# Patient Record
Sex: Female | Born: 1945 | Race: White | Hispanic: No | Marital: Married | Smoking: Never smoker
Health system: Southern US, Community
[De-identification: ages and names within clinical notes are randomized; demographics above are authoritative.]

## PROBLEM LIST (undated history)

## (undated) DIAGNOSIS — I480 Paroxysmal atrial fibrillation: Secondary | ICD-10-CM

## (undated) DIAGNOSIS — I10 Essential (primary) hypertension: Secondary | ICD-10-CM

## (undated) DIAGNOSIS — I509 Heart failure, unspecified: Secondary | ICD-10-CM

## (undated) DIAGNOSIS — M1990 Unspecified osteoarthritis, unspecified site: Secondary | ICD-10-CM

## (undated) DIAGNOSIS — Z853 Personal history of malignant neoplasm of breast: Secondary | ICD-10-CM

## (undated) HISTORY — PX: CHOLECYSTECTOMY OPEN: SUR202

## (undated) HISTORY — PX: WRIST SURGERY: SHX841

## (undated) HISTORY — DX: Paroxysmal atrial fibrillation: I48.0

## (undated) HISTORY — DX: Essential (primary) hypertension: I10

## (undated) HISTORY — PX: TOTAL KNEE ARTHROPLASTY: SHX125

## (undated) HISTORY — PX: MASTECTOMY, RADICAL: SHX710

## (undated) HISTORY — DX: Unspecified osteoarthritis, unspecified site: M19.90

## (undated) HISTORY — DX: Personal history of malignant neoplasm of breast: Z85.3

## (undated) HISTORY — DX: Heart failure, unspecified: I50.9

---

## 2013-09-18 ENCOUNTER — Observation Stay: Payer: Self-pay | Admitting: Student

## 2013-09-18 DIAGNOSIS — I10 Essential (primary) hypertension: Secondary | ICD-10-CM

## 2013-09-18 DIAGNOSIS — R0609 Other forms of dyspnea: Secondary | ICD-10-CM

## 2013-09-18 DIAGNOSIS — R079 Chest pain, unspecified: Secondary | ICD-10-CM

## 2013-09-18 DIAGNOSIS — R0989 Other specified symptoms and signs involving the circulatory and respiratory systems: Secondary | ICD-10-CM

## 2013-09-18 LAB — URINALYSIS, COMPLETE
Bacteria: NONE SEEN
Bilirubin,UR: NEGATIVE
Blood: NEGATIVE
GLUCOSE, UR: NEGATIVE mg/dL (ref 0–75)
LEUKOCYTE ESTERASE: NEGATIVE
Nitrite: NEGATIVE
Ph: 7 (ref 4.5–8.0)
Protein: NEGATIVE
RBC,UR: 1 /HPF (ref 0–5)
SPECIFIC GRAVITY: 1.015 (ref 1.003–1.030)

## 2013-09-18 LAB — CBC
HCT: 38.9 % (ref 35.0–47.0)
HGB: 13.3 g/dL (ref 12.0–16.0)
MCH: 29.8 pg (ref 26.0–34.0)
MCHC: 34.2 g/dL (ref 32.0–36.0)
MCV: 87 fL (ref 80–100)
Platelet: 281 10*3/uL (ref 150–440)
RBC: 4.48 10*6/uL (ref 3.80–5.20)
RDW: 14.9 % — ABNORMAL HIGH (ref 11.5–14.5)
WBC: 8.1 10*3/uL (ref 3.6–11.0)

## 2013-09-18 LAB — COMPREHENSIVE METABOLIC PANEL
ALT: 37 U/L (ref 12–78)
Albumin: 3.7 g/dL (ref 3.4–5.0)
Alkaline Phosphatase: 111 U/L
Anion Gap: 13 (ref 7–16)
BILIRUBIN TOTAL: 0.5 mg/dL (ref 0.2–1.0)
BUN: 16 mg/dL (ref 7–18)
CO2: 18 mmol/L — AB (ref 21–32)
Calcium, Total: 9.8 mg/dL (ref 8.5–10.1)
Chloride: 105 mmol/L (ref 98–107)
Creatinine: 0.91 mg/dL (ref 0.60–1.30)
EGFR (Non-African Amer.): >60
Glucose: 172 mg/dL — ABNORMAL HIGH (ref 65–99)
OSMOLALITY: 277 (ref 275–301)
POTASSIUM: 3.7 mmol/L (ref 3.5–5.1)
SGOT(AST): 21 U/L (ref 15–37)
Sodium: 136 mmol/L (ref 136–145)
Total Protein: 7.9 g/dL (ref 6.4–8.2)

## 2013-09-18 LAB — TROPONIN I
TROPONIN-I: 0.03 ng/mL
TROPONIN-I: 0.03 ng/mL
Troponin-I: 0.03 ng/mL

## 2013-09-18 LAB — CK TOTAL AND CKMB (NOT AT ARMC)
CK, Total: 49 U/L
CK, Total: 65 U/L
CK-MB: 1.1 ng/mL (ref 0.5–3.6)
CK-MB: 1.7 ng/mL (ref 0.5–3.6)

## 2013-09-18 LAB — D-DIMER(ARMC): D-Dimer: 565 ng/ml

## 2013-09-18 LAB — PRO B NATRIURETIC PEPTIDE: B-Type Natriuretic Peptide: 248 pg/mL — ABNORMAL HIGH (ref 0–125)

## 2013-09-18 LAB — PROTIME-INR
INR: 1
PROTHROMBIN TIME: 12.9 s (ref 11.5–14.7)

## 2013-09-19 DIAGNOSIS — I517 Cardiomegaly: Secondary | ICD-10-CM

## 2013-09-19 DIAGNOSIS — R079 Chest pain, unspecified: Secondary | ICD-10-CM

## 2013-09-19 LAB — CBC WITH DIFFERENTIAL/PLATELET
BASOS ABS: 0.1 10*3/uL (ref 0.0–0.1)
Basophil %: 1.1 %
EOS PCT: 0.2 %
Eosinophil #: 0 10*3/uL (ref 0.0–0.7)
HCT: 38.3 % (ref 35.0–47.0)
HGB: 12.9 g/dL (ref 12.0–16.0)
LYMPHS PCT: 16 %
Lymphocyte #: 1.9 10*3/uL (ref 1.0–3.6)
MCH: 29.4 pg (ref 26.0–34.0)
MCHC: 33.7 g/dL (ref 32.0–36.0)
MCV: 87 fL (ref 80–100)
MONO ABS: 1.1 x10 3/mm — AB (ref 0.2–0.9)
Monocyte %: 9.4 %
Neutrophil #: 8.7 10*3/uL — ABNORMAL HIGH (ref 1.4–6.5)
Neutrophil %: 73.3 %
PLATELETS: 261 10*3/uL (ref 150–440)
RBC: 4.4 10*6/uL (ref 3.80–5.20)
RDW: 14.9 % — ABNORMAL HIGH (ref 11.5–14.5)
WBC: 11.9 10*3/uL — ABNORMAL HIGH (ref 3.6–11.0)

## 2013-09-19 LAB — BASIC METABOLIC PANEL
Anion Gap: 12 (ref 7–16)
BUN: 11 mg/dL (ref 7–18)
CALCIUM: 8.8 mg/dL (ref 8.5–10.1)
CO2: 22 mmol/L (ref 21–32)
CREATININE: 0.76 mg/dL (ref 0.60–1.30)
Chloride: 105 mmol/L (ref 98–107)
EGFR (Non-African Amer.): >60
GLUCOSE: 111 mg/dL — AB (ref 65–99)
Osmolality: 278 (ref 275–301)
Potassium: 3.6 mmol/L (ref 3.5–5.1)
SODIUM: 139 mmol/L (ref 136–145)

## 2013-09-19 LAB — TROPONIN I: TROPONIN-I: 0.03 ng/mL

## 2013-09-19 LAB — CK TOTAL AND CKMB (NOT AT ARMC)
CK, Total: 104 U/L
CK-MB: 1.6 ng/mL (ref 0.5–3.6)

## 2013-09-22 ENCOUNTER — Telehealth: Payer: Self-pay

## 2013-09-22 NOTE — Telephone Encounter (Signed)
Patient contacted regarding discharge from [facility] on 09/19/13.  Patient understands to follow up with Ward Givens, NP on 09/24/13 at 1:15 at [facility]. Patient understands discharge instructions? yes Patient understands medications and regiment? yes Patient understands to bring all medications to this visit? yes  Pt reports that she is still experiencing occasional dizziness.

## 2013-09-23 ENCOUNTER — Encounter: Payer: Self-pay | Admitting: *Deleted

## 2013-09-24 ENCOUNTER — Encounter: Payer: Self-pay | Admitting: Nurse Practitioner

## 2013-09-24 ENCOUNTER — Encounter: Payer: Self-pay | Admitting: *Deleted

## 2014-08-15 NOTE — H&P (Signed)
PATIENT NAME:  Julia Kent, Julia Kent MR#:  [id number] DATE OF BIRTH:  [birth year]  DATE OF ADMISSION:  09/18/2013  PRIMARY CARE PHYSICIAN:  Nonlocal at [facility].   CHIEF COMPLAINT:  Sudden onset of dyspnea.   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  The patient said she went to [place] for shopping today.  She did not take her Lasix though this morning to avoid increased frequency of urination while shopping.  All of a sudden she felt sudden onset of shortness of breath, heavy tightening in the chest and had to call EMS who brought her over here.  The patient had a prior cardiac catheterization more than seven years ago and had insignificant coronary artery disease at the time.  She is not sure if she has systolic or diastolic congestive heart failure.  Her chest x-ray and CT chest were clear here without any PE or evidence of pulmonary congestion.  Her BNP is only slightly elevated.  With her worsening dyspneic episodes with diaphoresis, the chest tightness it is considered anginal equivalent, she is being admitted for observation for the same.  She denies any recent illness or other changes.   PAST MEDICAL HISTORY: 1.  Paroxysmal atrial fibrillation.  2.  Congestive heart failure, unknown ejection fraction.  3.  Osteoarthritis.  4.  Hypertension.  5.  History of breast cancer status post surgery, currently in remission.  PAST SURGICAL HISTORY: 1.  Open cholecystectomy.  2.  Left modified radical mastectomy.  3.  Left knee replacement surgery.  4.  Left wrist surgery.    ALLERGIES TO MEDICATIONS:  INTOLERANT OF NSAIDS, ANAPHYLACTIC TO ASPIRIN AND ALSO ALLERGIC TO EGGS.   MEDICATIONS:  1.  Lasix 40 mg by mouth twice daily.  2.  Potassium chloride 20 mEq  by mouth twice daily.  3.  Cardizem 180 mg by mouth daily.  4.  Atorvastatin 40 mg by mouth daily.  5.  Travatan one drop each eye at bedtime.  6.  Micardis, unknown dose.  7.  Neurontin 300 to 600 mg at bedtime for sciatica pain.  8.  Tramadol 50 mg by mouth q. 8 hours as needed.  9.  Vitamin D3 4000 international units daily.   SOCIAL HISTORY:  Retired Equities trader.  No smoking.  Very occasional alcohol intake.   FAMILY HISTORY:  Father died from heart disease in his 50s and no history of any cancer or strokes in the family.   REVIEW OF SYSTEMS:  CONSTITUTIONAL:  No fever, fatigue or weakness.  EYES:  No blurred vision, double vision, inflammation or glaucoma.  EARS, NOSE, THROAT:  No tinnitus, ear pain, hearing loss, epistaxis or discharge.  RESPIRATORY:  No cough, wheeze, hemoptysis or COPD.  CARDIOVASCULAR:  No chest pain.  Positive for tightness and dyspnea on exertion.  No orthopnea, edema, arrhythmias, palpitations, or syncope.  GASTROINTESTINAL:  No nausea, vomiting, diarrhea, abdominal pain, hematemesis, or melena.  GENITOURINARY:  No dysuria, hematuria, renal calculus, frequency or incontinence.  ENDOCRINE:  No polyuria, nocturia, thyroid problems, heat or cold intolerance.  HEMATOLOGY:  No anemia, easy bruising or bleeding.  SKIN:  No acne, rash or lesions.  MUSCULOSKELETAL:  No neck, back, shoulder pain, arthritis or gout.  NEUROLOGIC:  Denies numbness, weakness, CVA, TIA or seizures.  PSYCHOLOGICAL:  No anxiety, insomnia,  depression.   PHYSICAL EXAMINATION: VITAL SIGNS:  Temperature 97.3 degrees Fahrenheit, pulse 60, respirations 26, blood pressure 138/78, pulse ox 100% on room air.  GENERAL:  Heavily built, well-nourished female lying in bed, not in any acute distress.  HEENT:  Normocephalic, atraumatic.  Pupils equal, round, reacting to light.  Anicteric sclerae.  Extraocular movements intact.  Oropharynx clear without erythema, mass or exudates.  NECK:  Supple.   No thyromegaly, JVD or carotid bruits.  No lymphadenopathy.  LUNGS:  Moving air bilaterally.  No wheeze or crackles.  No use of accessory muscles for breathing CARDIOVASCULAR:  S1, S2, regular rate and rhythm.  No murmurs, rubs, or gallops.  ABDOMEN:  Obese, soft, nontender, nondistended.  No hepatosplenomegaly.  Normal bowel sounds.  EXTREMITIES:  No pedal edema.  No clubbing or cyanosis, 2+ dorsalis pedis pulses palpable bilaterally.  SKIN:  No acne, rash or lesions.  LYMPHATICS:  No cervical or inguinal lymphadenopathy.  NEUROLOGIC:  Cranial nerves are intact.  No focal motor or sensory deficits.  PSYCHOLOGICAL:  The patient is awake, alert, oriented x 3.   LABORATORY DATA:  WBC is 8.2, hemoglobin 13.3, hematocrit 38.9, platelet count 281.   Sodium 136, potassium 3.7, chloride 105, bicarb 18, BUN 16, creatinine 0.91, glucose 172 and calcium of 9.8.   ALT 37, AST 21, alk phos 111, total bili 0.5 and albumin of 3.7.  BNP is 248.  Urinalysis negative for any infection.  Chest x-ray showing low lung volumes, no acute findings.  CT of the chest with contrast showing no evidence of PE or other acute findings.  Previous left mastectomy, post radiation changes seen and small hiatal hernia noted.  EKG showing normal sinus rhythm, heart rate of 62.  No acute ST-T wave abnormalities noted.   ASSESSMENT AND PLAN:  A 69 year old diagnosed with history of paroxysmal atrial fibrillation, hypertension, congestive heart failure, admitted for worsening dyspnea and chest tightness for two weeks now.  1.  Angina equivalent with dyspnea on exertion.  CT chest being normal.  We will admit to telemetry.  Recycle cardiac enzymes.  Myoview in a.m.  Cardiology has been consulted.  THE PATIENT IS ANAPHYLACTIC TO ASPIRIN SO WE WILL NOT START ASPIRIN.  Continue to monitor.  2.  Congestive heart failure, unknown ejection fraction.  Follow up at [facility] as prior scheduled. Hold lasix as no fluid overload on chest x-ray or CT  chest seen.  Continue to follow. 3.  Atrial fibrillation in normal sinus rhythm, not on any anticoagulation, has been in normal sinus rhythm for a long time according to the patient.  She has a cardiologist followup at [facility] as an outpatient.  Continue Cardizem for now.  4.  Sciatica.  Continue her home pain medications.  5.  CODE STATUS:  FULL CODE.    TIME SPENT ON ADMISSION:  50 minutes.     ____________________________ Gladstone Lighter, MD rk:ea D: 09/18/2013 19:54:29 ET T: 09/18/2013 23:31:02 ET JOB#: [id number]  cc: Gladstone Lighter, MD, <Dictator> Gladstone Lighter MD ELECTRONICALLY SIGNED 09/19/2013 12:44

## 2014-08-15 NOTE — Consult Note (Signed)
PATIENT NAME:  Julia Kent, Julia Kent MR#:  [id number] DATE OF BIRTH:  05/02/1945  DATE OF CONSULTATION:  09/18/2013  REFERRING PHYSICIAN: Dr. Nemiah CommanderKalisetti CONSULTING PHYSICIAN:  Jerolyn CenterMuhammad A. Kirke CorinArida, MD  REASON FOR CONSULTATION: Shortness of breath and chest pain.   PRIMARY CARDIOLOGIST: Dr. Launa FlightEric Moore at [facility].   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 She reports experiencing worsening exertional dyspnea over the last few months. She saw her cardiologist and was given an extra dose of Lasix. She did not take Lasix yesterday morning as she was trying to do some shopping. All of a sudden, she felt increased dyspnea with a heavy tightness feeling in the chest. She called EMS and was brought here. She did have previous cardiac catheterization in 2008. Results are not available, but she was told that there was no significant coronary artery disease. BNP was only slightly elevated on presentation. CT chest showed no evidence of pulmonary embolism or pulmonary congestion. She is feeling better this morning.   PAST MEDICAL HISTORY:  1.  Paroxysmal atrial fibrillation with no recent recurrences.  2.  Congestive heart failure with unknown ejection fraction.  3.  Osteoarthritis.  4.  Hypertension.  5.  History of breast cancer, currently in remission.   HOME MEDICATIONS: Include: 1.  Lasix 40 mg twice daily.  2.  Potassium chloride 20 mEq twice daily.  3.  Diltiazem 150 mg once daily.  4.  Atorvastatin 40 mg once daily. 5.  Eye drops at bedtime,  6.  Micardis, unknown dose.  7.  Neurontin.  8.  Tramadol.  9.  Vitamin D.   ALLERGIES: She is allergic to NSAIDS AND ASPIRIN.   SOCIAL HISTORY: She is a retired Designer, jewelleryregistered nurse. She denies any smoking. She drinks alcohol occasionally.   FAMILY HISTORY: Father died of heart disease in his 7050s.    REVIEW OF SYSTEMS: A 10-point review of systems was performed. It is negative other than what is mentioned in the HPI.   PHYSICAL EXAMINATION:  GENERAL: The patient appears to be at her stated age, in no acute distress.  VITAL SIGNS: Temperature 98, pulse 71, respiratory rate 20, blood pressure is 130/64 and oxygen saturation is 97% on 2 L nasal cannula.  HEENT: Normocephalic, atraumatic.  NECK: No JVD or carotid bruits.  RESPIRATORY: Normal respiratory effort with no use of accessory muscles. Auscultation reveals normal breath sounds.  CARDIOVASCULAR: Normal PMI. Normal S1 and S2 with no gallops or murmurs.  ABDOMEN: Benign, nontender and nondistended.  EXTREMITIES: No clubbing, cyanosis, or edema.  SKIN: Warm and dry with no rash.  PSYCHIATRIC: She is alert, oriented x 3 with normal mood and affect.   LABORATORY AND DIAGNOSTIC DATA: Cardiac enzymes were normal. ECG showed normal sinus rhythm with no significant ST or T wave changes. BNP was only 248.   IMPRESSION:  1.  Acute dyspnea and chest pain.  2.  History of paroxysmal atrial fibrillation with no recent recurrent episodes.  3.  History of congestive heart failure, likely diastolic based on her history.  4.  Hypertension.   RECOMMENDATIONS: The patient had sudden onset of dyspnea with some mild chest tightness. So far, she has ruled out for myocardial infarction and EKG does not show any acute ischemic changes. CT of the chest showed no evidence of pulmonary embolism. It is possible that symptoms were triggered by not taking Lasix yesterday morning. However,  she has multiple risk factors for coronary artery disease and thus I agree with further ischemic work-up. She is scheduled for a pharmacologic nuclear stress test today. I suspect that she has diastolic heart failure. An echocardiogram was requested. Further recommendations to follow after noninvasive testing.  ____________________________ Chelsea Aus. Kirke Corin,  MD maa:aw D: 09/19/2013 08:51:00 ET T: 09/19/2013 09:17:28 ET JOB#: [id number]  cc: Taquanna Borras A. Kirke Corin, MD, <Dictator> Iran Ouch MD ELECTRONICALLY SIGNED 09/30/2013 17:09

## 2014-08-15 NOTE — Discharge Summary (Signed)
PATIENT NAME:  Julia Kent, FISCHBACH MR#:  [id number] DATE OF BIRTH:  [birth year]  DATE OF ADMISSION:  09/18/2013 DATE OF DISCHARGE:  09/19/2013  CONSULTANTS:  Dr. Kirke Corin from cardiology.   PRIMARY CARE PHYSICIAN:  Dr. Dayna Barker.   CHIEF COMPLAINT:  Chest pain, sudden dyspnea.   DISCHARGE DIAGNOSES: 1.  Chest pain, likely not cardiac.  2.  Paroxysmal atrial fibrillation.  3.  Congestive heart failure, likely diastolic, chronic in nature.  4.  Osteoarthritis.  5.  Hypertension.  6.  History of breast cancer, status post surgery, currently in remission.   DISCHARGE MEDICATIONS:  Diltiazem extended-release 180 mg once a day, Lasix 40 mg 2 times a day, Micardis 80 mg once a day, Travatan 0.004% ophthalmic one drop to each affected eye once a day, Lipitor 20 mg once a day, Ultram 50 mg every four hours as needed for pain, gabapentin 300 mg once a day at bedtime, vitamin D 1000 units once a day.   DIET:  Low sodium.   ACTIVITY:  As tolerated.   FOLLOWUP:  Please follow with your PCP and cardiologist within 1 to 2 weeks.  If any further chest pain, pressure, shortness of breath or any other symptoms, call your doctor right away.   DISPOSITION:  Home.   CODE STATUS:  THE PATIENT IS A FULL CODE.   SIGNIFICANT LABORATORY AND IMAGING:  UA does not suggest infection.  PT 12.9, INR 1 on arrival.  D-dimer was 565.  Initial white count of 8.1.  Troponins negative x 3.  CK-MB and CK total within normal limits x 3.  Initial LFTs within normal limits.  BNP 248, glucose 172, BUN 16, creatinine 0.91.  Echocardiogram showing normal EF of 55% to 60% with pseudonormal pattern of LV diastolic filling.  Myocardial stress test showing no significant wall motion abnormalities, low risk scan with EF of 55%.  No EKG changes concerning for ischemia.  There is a GI uptake artifact noted on this study.  The study is suboptimal.  There is inferior wall defect which is worse on rest.  This is likely due to artifact.  CT angio of  the chest for PE protocol showing no evidence of PE or other acute findings.  Small hiatal hernia.  Previous left mastectomy and postradiation changes in the left upper lobe.   HISTORY OF PRESENT ILLNESS AND HOSPITAL COURSE:  For full details of H and P, please see the dictation on day of admission by Dr. 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  She had sudden onset shortness of breath, chest tightness and came into the hospital.  Here, she had BNP which is slightly elevated, not much, and given her symptoms with sudden onset dyspnea on exertion, diaphoresis, underwent a CT angio of the chest which did not show any PE, significant pneumonia or acute changes.  She was admitted to the hospitalist service for observation and evaluation for this for possible angina equivalent with dyspnea on exertion.  She was ruled out for acute coronary syndrome with cyclic cardiac markers and she was seen by Dr. Kirke Corin from cardiology.  She underwent a stress test which was dictated above and this is overall at lower risk, but a suboptimal scan.  At this point, she does not appear to have any significant infection  criteria either without any significant fevers or leukocytosis on admission.  She had a negative UA.  She is not hypoxic.  She does not appear to have a pneumonia or active CHF.  Echocardiogram was done showing normal EF.  I suspect she has likely chronic diastolic CHF in nature and she is not volume overloaded.   PHYSICAL EXAMINATION: VITAL SIGNS:  On the day of discharge, temperature is 97.4, pulse rate 73, respiratory rate 22, blood pressure 134/69, O2 sat 98%.  GENERAL:  The patient is an obese female lying in bed in no obvious distress.  HEENT:  Normocephalic, atraumatic.   HEART:  Normal S1, S2.  LUNGS:  Clear to auscultation without wheezing, rhonchi, or rales.  No significant swelling in the legs.  ABDOMEN:  Benign.   Total time spent 32 minutes.     ____________________________ Krystal EatonShayiq Braeden Dolinski, MD sa:ea D: 09/19/2013 15:41:12 ET T: 09/20/2013 01:58:49 ET JOB#: [id number]  cc: Krystal EatonShayiq Donyel Nester, MD, <Dictator> Katina DungBarbara D. Dayna BarkerAldridge, MD Krystal EatonSHAYIQ Raymar Joiner MD ELECTRONICALLY SIGNED 10/02/2013 10:56

## 2016-09-20 ENCOUNTER — Emergency Department: Payer: Medicare Other

## 2016-09-20 ENCOUNTER — Emergency Department
Admission: EM | Admit: 2016-09-20 | Discharge: 2016-09-20 | Disposition: A | Discharge status: Home / Self Care | Payer: Medicare Other | Attending: Student in an Organized Health Care Education/Training Program | Admitting: Student in an Organized Health Care Education/Training Program

## 2016-09-20 DIAGNOSIS — Z79899 Other long term (current) drug therapy: Secondary | ICD-10-CM | POA: Insufficient documentation

## 2016-09-20 DIAGNOSIS — I509 Heart failure, unspecified: Secondary | ICD-10-CM | POA: Diagnosis not present

## 2016-09-20 DIAGNOSIS — I110 Hypertensive heart disease with heart failure: Secondary | ICD-10-CM | POA: Insufficient documentation

## 2016-09-20 DIAGNOSIS — N1339 Other hydronephrosis: Secondary | ICD-10-CM | POA: Insufficient documentation

## 2016-09-20 DIAGNOSIS — R109 Unspecified abdominal pain: Secondary | ICD-10-CM | POA: Diagnosis present

## 2016-09-20 DIAGNOSIS — N201 Calculus of ureter: Secondary | ICD-10-CM | POA: Diagnosis not present

## 2016-09-20 LAB — CBC WITH DIFFERENTIAL/PLATELET
BASOS ABS: 0.1 10*3/uL (ref 0–0.1)
Basophils Relative: 1 %
EOS ABS: 0 10*3/uL (ref 0–0.7)
Eosinophils Relative: 0 %
HCT: 40.1 % (ref 35.0–47.0)
HEMOGLOBIN: 13.5 g/dL (ref 12.0–16.0)
LYMPHS ABS: 0.6 10*3/uL — AB (ref 1.0–3.6)
LYMPHS PCT: 4 %
MCH: 29.1 pg (ref 26.0–34.0)
MCHC: 33.7 g/dL (ref 32.0–36.0)
MCV: 86.5 fL (ref 80.0–100.0)
Monocytes Absolute: 0.2 10*3/uL (ref 0.2–0.9)
Monocytes Relative: 2 %
Neutro Abs: 12.2 10*3/uL — ABNORMAL HIGH (ref 1.4–6.5)
Neutrophils Relative %: 93 %
Platelets: 248 10*3/uL (ref 150–440)
RBC: 4.64 MIL/uL (ref 3.80–5.20)
RDW: 14.5 % (ref 11.5–14.5)
WBC: 13.1 10*3/uL — AB (ref 3.6–11.0)

## 2016-09-20 LAB — URINALYSIS, COMPLETE (UACMP) WITH MICROSCOPIC
BACTERIA UA: NONE SEEN
Bilirubin Urine: NEGATIVE
Glucose, UA: NEGATIVE mg/dL
Hgb urine dipstick: NEGATIVE
Ketones, ur: 20 mg/dL — AB
Leukocytes, UA: NEGATIVE
Nitrite: NEGATIVE
Protein, ur: NEGATIVE mg/dL
SPECIFIC GRAVITY, URINE: 1.015 (ref 1.005–1.030)
pH: 7 (ref 5.0–8.0)

## 2016-09-20 LAB — BASIC METABOLIC PANEL
Anion gap: 13 (ref 5–15)
BUN: 15 mg/dL (ref 6–20)
CO2: 21 mmol/L — AB (ref 22–32)
CREATININE: 0.91 mg/dL (ref 0.44–1.00)
Calcium: 9.6 mg/dL (ref 8.9–10.3)
Chloride: 103 mmol/L (ref 101–111)
GLUCOSE: 163 mg/dL — AB (ref 65–99)
Potassium: 3.9 mmol/L (ref 3.5–5.1)
SODIUM: 137 mmol/L (ref 135–145)

## 2016-09-20 MED ORDER — PROMETHAZINE HCL 12.5 MG PO TABS: 12.5000 mg | ORAL_TABLET | Freq: Four times a day (QID) | ORAL | 0 refills | Status: DC | PRN

## 2016-09-20 MED ORDER — TRAMADOL HCL 50 MG PO TABS: 50.0000 mg | ORAL_TABLET | Freq: Four times a day (QID) | ORAL | 0 refills | Status: AC | PRN

## 2016-09-20 MED ORDER — SODIUM CHLORIDE 0.9 % IV BOLUS (SEPSIS)
1000.0000 mL | Freq: Once | INTRAVENOUS | Status: AC
  Administered 2016-09-20: 1000 mL via INTRAVENOUS

## 2016-09-20 MED ORDER — HALOPERIDOL LACTATE 5 MG/ML IJ SOLN
5.0000 mg | Freq: Once | INTRAMUSCULAR | Status: AC
  Administered 2016-09-20: 5 mg via INTRAMUSCULAR
  Filled 2016-09-20: qty 1

## 2016-09-20 MED ORDER — TAMSULOSIN HCL 0.4 MG PO CAPS: 0.4000 mg | ORAL_CAPSULE | Freq: Every day | ORAL | 0 refills | Status: AC

## 2016-09-20 MED ORDER — KETOROLAC TROMETHAMINE 30 MG/ML IJ SOLN
15.0000 mg | Freq: Once | INTRAMUSCULAR | Status: AC
  Administered 2016-09-20: 15 mg via INTRAVENOUS
  Filled 2016-09-20: qty 1

## 2016-09-20 MED ORDER — TAMSULOSIN HCL 0.4 MG PO CAPS
0.4000 mg | ORAL_CAPSULE | Freq: Every day | ORAL | Status: DC
  Administered 2016-09-20: 0.4 mg via ORAL
  Filled 2016-09-20: qty 1

## 2016-09-20 MED ORDER — ONDANSETRON HCL 4 MG/2ML IJ SOLN
4.0000 mg | Freq: Once | INTRAMUSCULAR | Status: AC
  Administered 2016-09-20: 4 mg via INTRAVENOUS
  Filled 2016-09-20: qty 2

## 2016-09-20 MED ORDER — MORPHINE SULFATE (PF) 4 MG/ML IV SOLN
4.0000 mg | INTRAVENOUS | Status: DC | PRN
  Administered 2016-09-20: 4 mg via INTRAVENOUS
  Filled 2016-09-20: qty 1

## 2016-09-20 NOTE — ED Provider Notes (Signed)
[facility] Emergency Department Provider Note    First MD Initiated Contact with Patient 09/20/16 1637     (approximate)  I have reviewed the triage vital signs and the nursing notes.   HISTORY  Chief Complaint Flank Pain    HPI Julia Kent is a 71 y.o. female presents with acute left flank pain that is throbbing in nature and started at home. States that this occurred suddenly and is 10 out of 10 in severity. No radiation. Denies any dysuria. No fevers. No history kidney stones. Denies any shortness of breath but does feel nauseated. Has not taken anything for pain.     Past Medical History:  Diagnosis Date  . CHF (congestive heart failure)   . Hx of breast cancer   . Hypertension   . Osteoarthritis   . Paroxysmal atrial fibrillation    Family History  Problem Relation Age of Onset  . Heart disease Father    Past Surgical History:  Procedure Laterality Date  . CHOLECYSTECTOMY OPEN    . MASTECTOMY, RADICAL    . TOTAL KNEE ARTHROPLASTY Left   . WRIST SURGERY     There are no active problems to display for this patient.     Prior to Admission medications   Medication Sig Start Date End Date Taking? Authorizing Provider  atorvastatin (LIPITOR) 20 MG tablet Take 20 mg by mouth daily.    [provider]  Cholecalciferol 1000 UNITS capsule Take 1,000 Units by mouth daily.    [provider]  diltiazem (DILACOR XR) 180 MG 24 hr capsule Take 180 mg by mouth daily.    [provider]  furosemide (LASIX) 40 MG tablet Take 40 mg by mouth 2 (two) times daily.    [provider]  gabapentin (NEURONTIN) 300 MG capsule Take 300 mg by mouth at bedtime.    [provider]  telmisartan (MICARDIS) 80 MG tablet Take 80 mg by mouth daily.    [provider]  traMADol (ULTRAM) 50 MG tablet Take 50 mg by mouth every 4 (four) hours as needed.    [provider]  travoprost, benzalkonium,  (TRAVATAN) 0.004 % ophthalmic solution 1 drop at bedtime.    [provider]    Allergies Aspirin; Eggs or egg-derived products; and Nsaids    Social History Social History  Substance Use Topics  . Smoking status: Never Smoker  . Smokeless tobacco: Not on file  . Alcohol use Yes     Comment: occasional    Review of Systems Patient denies headaches, rhinorrhea, blurry vision, numbness, shortness of breath, chest pain, edema, cough, abdominal pain, nausea, vomiting, diarrhea, dysuria, fevers, rashes or hallucinations unless otherwise stated above in HPI. ____________________________________________   PHYSICAL EXAM:  VITAL SIGNS: There were no vitals filed for this visit.  Constitutional: Alert and oriented. uncomfortable appearing but in no acute distress. Eyes: Conjunctivae are normal.  Head: Atraumatic. Nose: No congestion/rhinnorhea. Mouth/Throat: Mucous membranes are moist.   Neck: No stridor. Painless ROM.  Cardiovascular: Normal rate, regular rhythm. Grossly normal heart sounds.  Good peripheral circulation. Respiratory: Normal respiratory effort.  No retractions. Lungs CTAB. Gastrointestinal: Soft and nontender. No distention. No abdominal bruits. + left CVA ttp Genitourinary:  Musculoskeletal: No lower extremity tenderness nor edema.  No joint effusions. Neurologic:  Normal speech and language. No gross focal neurologic deficits are appreciated. No facial droop Skin:  Skin is warm, dry and intact. No rash noted. Psychiatric:  Speech and behavior are normal.  ____________________________________________   LABS (all labs ordered are listed, but only abnormal results are displayed)  No results found for this or any previous visit (from the past 24 hour(s)). ____________________________________________  EKG My review and personal interpretation at Time: 15:39   Indication: flank pain  Rate: 50  Rhythm: sinus Axis: left Other: poor r wave progression, no  STEMI ____________________________________________  RADIOLOGY  I personally reviewed all radiographic images ordered to evaluate for the above acute complaints and reviewed radiology reports and findings.  These findings were personally discussed with the patient.  Please see medical record for radiology report.  ____________________________________________   PROCEDURES  Procedure(s) performed:  Procedures    Critical Care performed: no ____________________________________________   INITIAL IMPRESSION / ASSESSMENT AND PLAN / ED COURSE  Pertinent labs & imaging results that were available during my care of the patient were reviewed by me and considered in my medical decision making (see chart for details).  DDX: stone, AAA, pyelo, msk strin, colitis  Julia Kent is a 71 y.o. who presents to the ED p/w acute left flank pain. No fevers, no systemic symptoms. - urinary symptoms. Denies trauma or injury. Afebrile in ED. Exam as above. Flank TTP, otherwise abdominal exam is benign. No peritoneal signs. Probable kidney stone, cystitis, or pyelonephritis. Will give IV fluids and obtain CT imaging.    Clinical Course as of Sep 21 1935  Wed Sep 20, 2016  1749 Discussed results of CT imaging with patient. Patient remains human dynamically stable and it does appear that her pain is improving. We'll give dose of Toradol and reassess.  [PR]    Clinical Course User Index [PR] Willy Eddy, MD   ----------------------------------------- 7:39 PM on 09/20/2016 -----------------------------------------  Patient reassessed and has 0 discomfort at this point. Able to tolerate oral hydration. Urinalysis shows no evidence of infection. I do suspect that she likely just passed the stone. Repeat ABD exam benign, will plan supportive treatment and early follow up for recheck.  ____________________________________________   FINAL CLINICAL IMPRESSION(S) / ED DIAGNOSES  Final diagnoses:    Left flank pain  Ureterolithiasis  Other hydronephrosis      NEW MEDICATIONS STARTED DURING THIS VISIT:  New Prescriptions   No medications on file     Note:  This document was prepared using Dragon voice recognition software and may include unintentional dictation errors.    Willy Eddy, MD 09/20/16 [phone]

## 2016-09-20 NOTE — ED Notes (Signed)
Patient transported to CT 

## 2016-09-20 NOTE — ED Notes (Signed)
Pt spouse at desk asking if theres anything else we can give for pain, RN informed that we have given pain meds and when the IM may take effect.

## 2016-09-20 NOTE — ED Notes (Signed)
RN entered room, pt is screaming at RN states " I can't take this pain anymore, I'm done" continues to scream at this RN for pain meds. RN informed pt that she just received morphine and will have to be seen by MD.

## 2016-10-10 ENCOUNTER — Encounter: Payer: Self-pay | Admitting: Urology

## 2016-10-10 ENCOUNTER — Ambulatory Visit (INDEPENDENT_AMBULATORY_CARE_PROVIDER_SITE_OTHER): Payer: Medicare Other | Admitting: Urology

## 2016-10-10 VITALS — BP 123/73 | HR 62 | Ht 62.0 in | Wt 168.3 lb

## 2016-10-10 DIAGNOSIS — N200 Calculus of kidney: Secondary | ICD-10-CM | POA: Diagnosis not present

## 2016-10-10 NOTE — Progress Notes (Signed)
10/10/2016 11:37 AM   Julia Kent [birth year] [id number]  Referring provider: Leim FabryAldridge, Barbara, MD [address] [place], [place] [id number]  No chief complaint on file.   HPI: 71 year old female who presents today for follow-up from the emergency room where she was seen for a left UVJ stone. The patient had significant hydroureteronephrosis at the time. Her stone was crowning on the CT scan. At the time of discharge in the operating room she's had no additional pain. She does not bring the stone with her today but feels quite sure that she passed it.  The patient denies any flank pain. She denies any hematuria or dysuria. She is any fevers chills.  The patient's CT scan demonstrates no additional stones within the urinary tract. I have independently reviewed this on my own as well as read the report.     PMH: Past Medical History:  Diagnosis Date  . CHF (congestive heart failure) (HCC)   . Hx of breast cancer   . Hypertension   . Osteoarthritis   . Paroxysmal atrial fibrillation [facility](HCC)     Surgical History: Past Surgical History:  Procedure Laterality Date  . CHOLECYSTECTOMY OPEN    . MASTECTOMY, RADICAL    . TOTAL KNEE ARTHROPLASTY Left   . WRIST SURGERY      Home Medications:  Allergies as of 10/10/2016      Reactions   Eggs Or Egg-derived Products Anaphylaxis   Ibuprofen Anaphylaxis   Naproxen Anaphylaxis   Nsaids Anaphylaxis   Aspirin    Sympathomimetics Other (See Comments)   Orthostasis, dyspnea at higher doses      Medication List       Accurate as of 10/10/16 11:37 AM. Always use your most recent med list.          atorvastatin 40 MG tablet Commonly known as:  LIPITOR Take 1 tablet by mouth daily.   Cholecalciferol 1000 units capsule Take 1,000 Units by mouth daily.   Vitamin D3 1000 units Caps Take 1 capsule by mouth daily.   diltiazem 240 MG 24 hr capsule Commonly known as:  DILACOR XR Take 1 capsule by mouth daily.   EPINEPHrine 0.3  mg/0.3 mL Soaj injection Commonly known as:  EPI-PEN Inject into the muscle.   furosemide 40 MG tablet Commonly known as:  LASIX Take 40 mg by mouth daily.   gabapentin 300 MG capsule Commonly known as:  NEURONTIN Take 300 mg by mouth at bedtime.   latanoprost 0.005 % ophthalmic solution Commonly known as:  XALATAN Place 1 drop into both eyes at bedtime.   losartan 100 MG tablet Commonly known as:  COZAAR Take 1 tablet by mouth daily.   nitroGLYCERIN 0.4 MG SL tablet Commonly known as:  NITROSTAT Place 1 tablet under the tongue every 5 (five) minutes as needed.   potassium chloride 10 MEQ tablet Commonly known as:  K-DUR,KLOR-CON Take 10 mEq by mouth 2 (two) times daily.   spironolactone 25 MG tablet Commonly known as:  ALDACTONE Take 1 tablet by mouth daily.   tamsulosin 0.4 MG Caps capsule Commonly known as:  FLOMAX Take 1 capsule (0.4 mg total) by mouth daily after supper.   traMADol 50 MG tablet Commonly known as:  ULTRAM Take 1 tablet (50 mg total) by mouth every 6 (six) hours as needed.       Allergies:  Allergies  Allergen Reactions  . Eggs Or Egg-Derived Products Anaphylaxis  . Ibuprofen Anaphylaxis  . Naproxen Anaphylaxis  . Nsaids Anaphylaxis  .  Aspirin   . Sympathomimetics Other (See Comments)    Orthostasis, dyspnea at higher doses    Family History: Family History  Problem Relation Age of Onset  . Heart disease Father     Social History:  reports that she has never smoked. She has never used smokeless tobacco. She reports that she drinks alcohol. She reports that she does not use drugs.  ROS:                                        Physical Exam: BP 123/73 (BP Location: Right Arm, Patient Position: Sitting, Cuff Size: Large)   Pulse 62   Ht 5\' 2"  (1.575 m)   Wt 76.3 kg (168 lb 4.8 oz)   BMI 30.78 kg/m   Constitutional:  Alert and oriented, No acute distress. HEENT: [place] AT, moist mucus membranes.  Trachea  midline, no masses. Cardiovascular: No clubbing, cyanosis, or edema. Respiratory: Normal respiratory effort, no increased work of breathing. GI: Abdomen is soft, nontender, nondistended, no abdominal masses GU: No CVA tenderness. Skin: No rashes, bruises or suspicious lesions. Lymph: No cervical or inguinal adenopathy. Neurologic: Grossly intact, no focal deficits, moving all 4 extremities. Psychiatric: Normal mood and affect.  Laboratory Data: Lab Results  Component Value Date   WBC 13.1 (H) 09/20/2016   HGB 13.5 09/20/2016   HCT 40.1 09/20/2016   MCV 86.5 09/20/2016   PLT 248 09/20/2016    Lab Results  Component Value Date   CREATININE 0.91 09/20/2016    No results found for: PSA  No results found for: TESTOSTERONE  No results found for: HGBA1C  Urinalysis    Component Value Date/Time   COLORURINE YELLOW (A) 09/20/2016 1659   APPEARANCEUR HAZY (A) 09/20/2016 1659   APPEARANCEUR Clear 09/18/2013 1350   LABSPEC 1.015 09/20/2016 1659   LABSPEC 1.015 09/18/2013 1350   PHURINE 7.0 09/20/2016 1659   GLUCOSEU NEGATIVE 09/20/2016 1659   GLUCOSEU Negative 09/18/2013 1350   HGBUR NEGATIVE 09/20/2016 1659   BILIRUBINUR NEGATIVE 09/20/2016 1659   BILIRUBINUR Negative 09/18/2013 1350   KETONESUR 20 (A) 09/20/2016 1659   PROTEINUR NEGATIVE 09/20/2016 1659   NITRITE NEGATIVE 09/20/2016 1659   LEUKOCYTESUR NEGATIVE 09/20/2016 1659   LEUKOCYTESUR Negative 09/18/2013 1350    Pertinent Imaging: CT scan as above, I have independently reviewed a written demonstrates no additional stones  Assessment & Plan:  The patient likely passed her left distal ureteral stone. She is asymptomatic. Her urinalysis today is clean.  1. Kidney stones I discussed stone prevention strategies with the patient, in particular I encouraged her to drink more water and lemonade. We also discussed a low salt diet. I suspect that if the patient is able to hydrate more thoroughly she will not have any  additional stones in the future. The patient will follow-up with me on an as-needed basis. - Urinalysis, Complete   Return if symptoms worsen or fail to improve.  Crist Fat, MD  [facility] [address], [address] [place], [place] [address] [phone]

## 2016-10-11 LAB — URINALYSIS, COMPLETE
Bilirubin, UA: NEGATIVE
GLUCOSE, UA: NEGATIVE
Ketones, UA: NEGATIVE
LEUKOCYTES UA: NEGATIVE
Nitrite, UA: NEGATIVE
Protein, UA: NEGATIVE
RBC UA: NEGATIVE
SPEC GRAV UA: 1.02 (ref 1.005–1.030)
UUROB: 0.2 mg/dL (ref 0.2–1.0)
pH, UA: 7 (ref 5.0–7.5)

## 2019-01-15 IMAGING — CT CT RENAL STONE PROTOCOL
2 of 4 series · 16 of 46 positions shown, 18 images · non-contrast
Comparison: None.

CLINICAL DATA: Acute left flank/left lower quadrant pain with
nausea. Concern for renal stone.

EXAM:
CT ABDOMEN AND PELVIS WITHOUT CONTRAST
TECHNIQUE: Multidetector CT imaging of the abdomen and pelvis was performed
following the standard protocol without IV contrast.

[Series 2: stone full standard · axial · 0.84mm/px · z∈[-919,-524]mm · 13 of 87 slices shown, 15 images]
[im 4/87  soft-tissue]
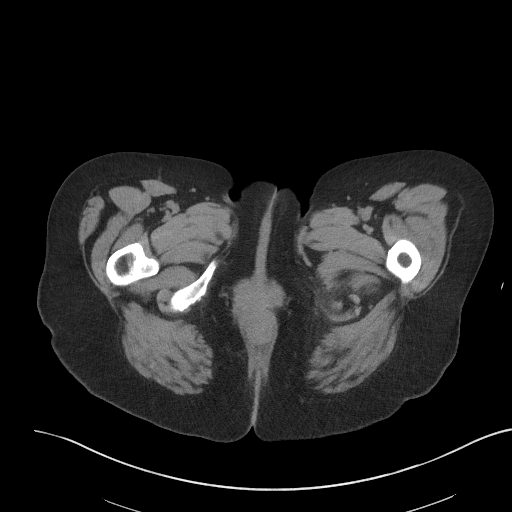
[im 4/87  bone]
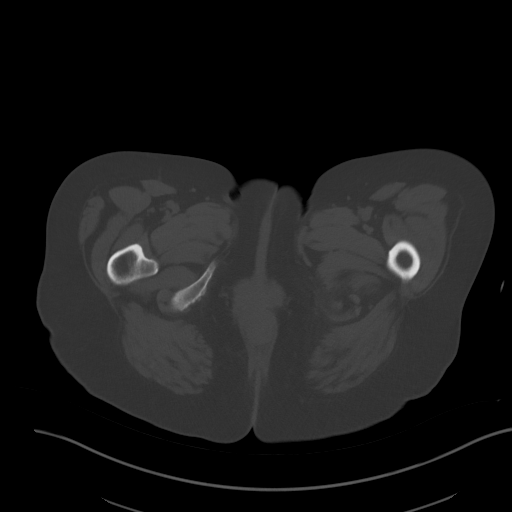
[im 11/87  soft-tissue]
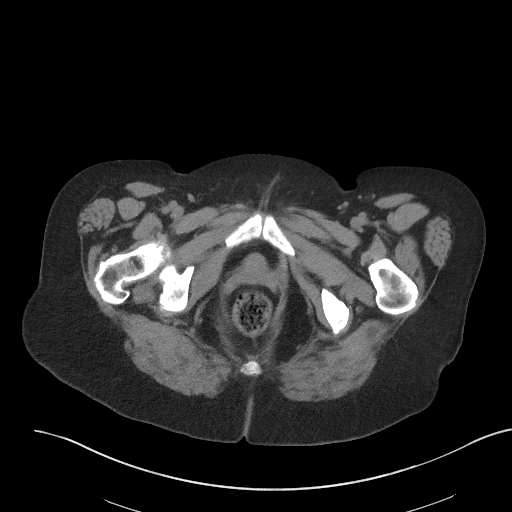
[im 18/87  soft-tissue]
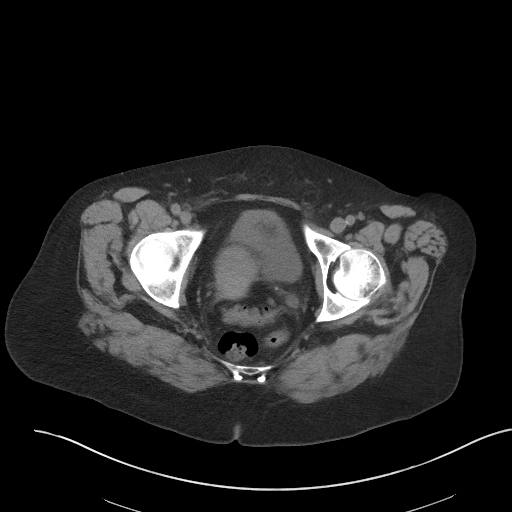
[im 25/87  soft-tissue]
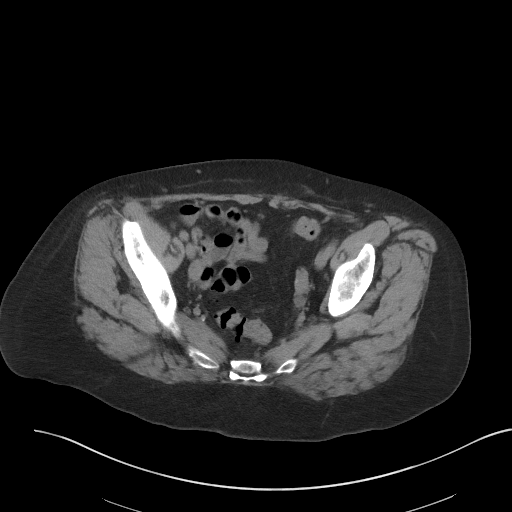
[im 31/87  soft-tissue]
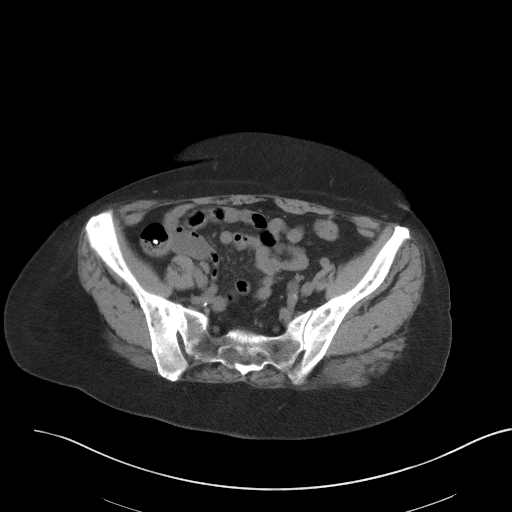
[im 38/87  soft-tissue]
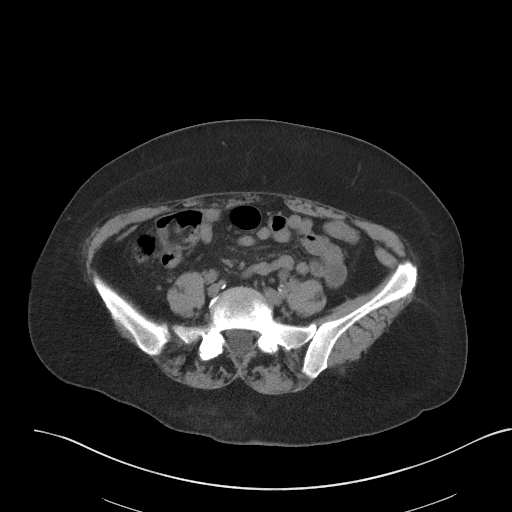
[im 45/87  soft-tissue]
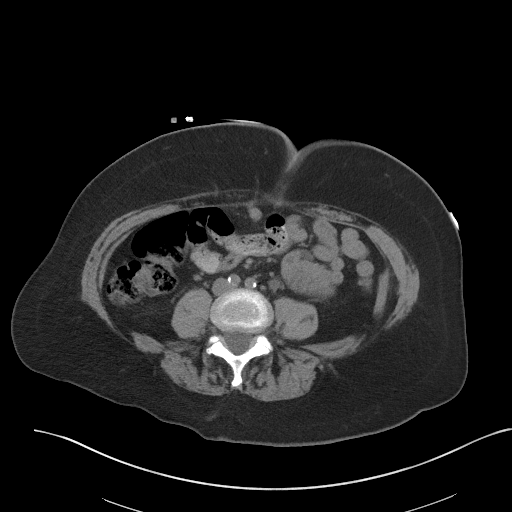
[im 49/87  soft-tissue]
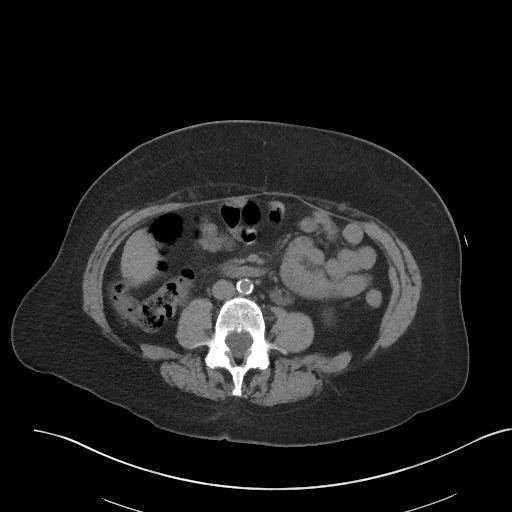
[im 56/87  soft-tissue]
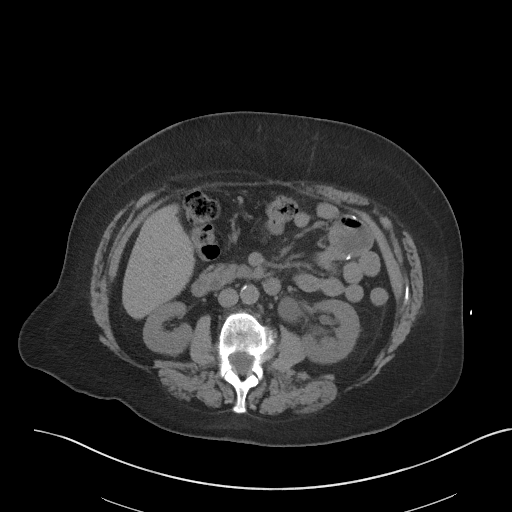
[im 56/87  bone]
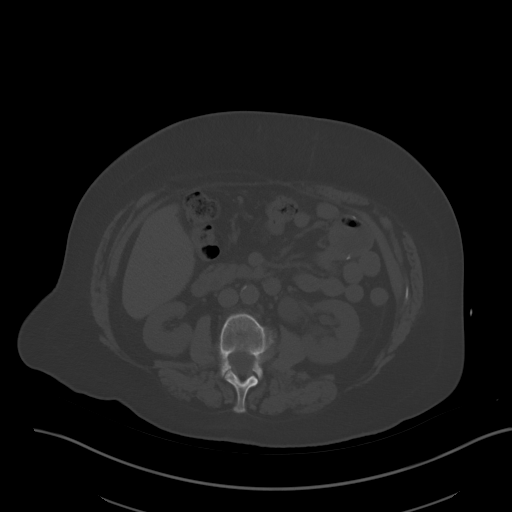
[im 62/87  soft-tissue]
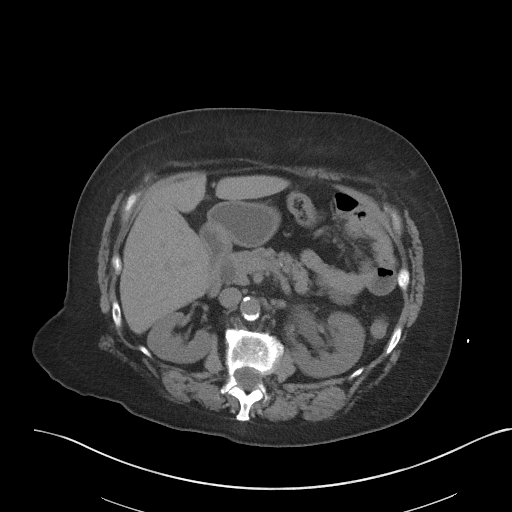
[im 69/87  soft-tissue]
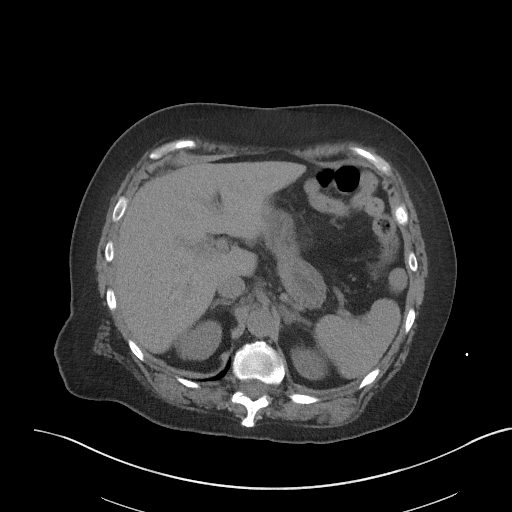
[im 76/87  soft-tissue]
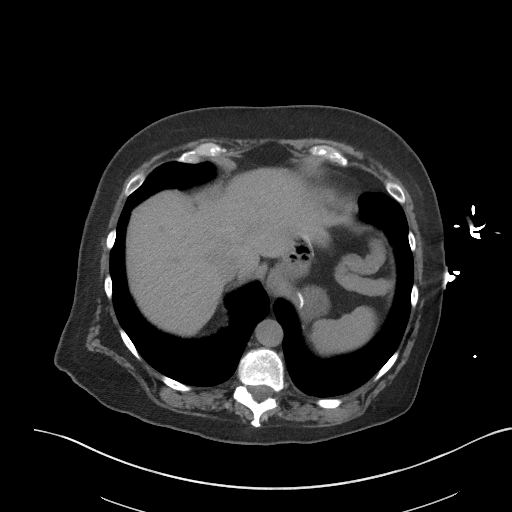
[im 83/87  soft-tissue]
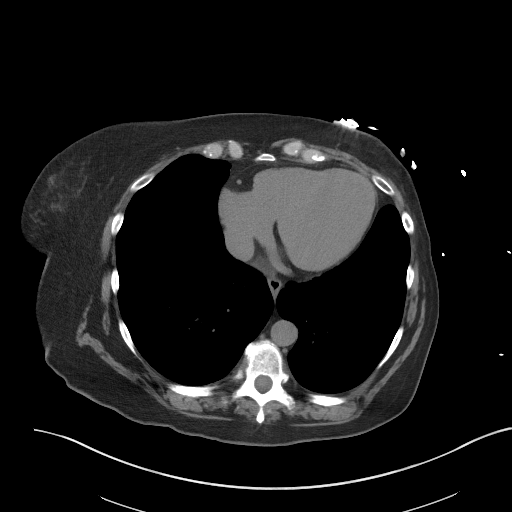

[Series 5: coronal · coronal · 0.73mm/px · 3 of 132 slices shown]
[im 44/132  soft-tissue]
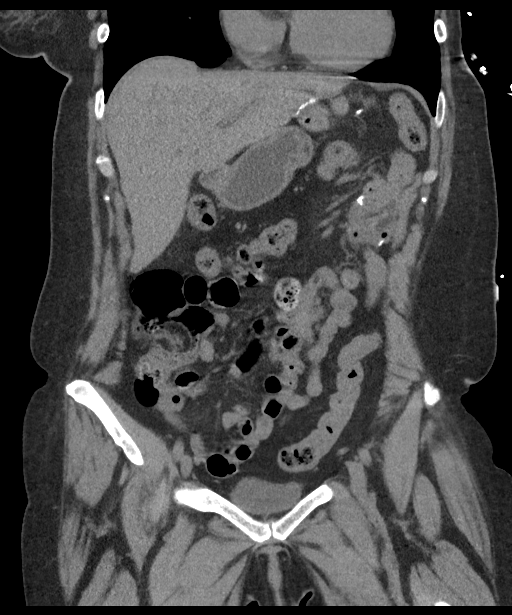
[im 59/132  soft-tissue]
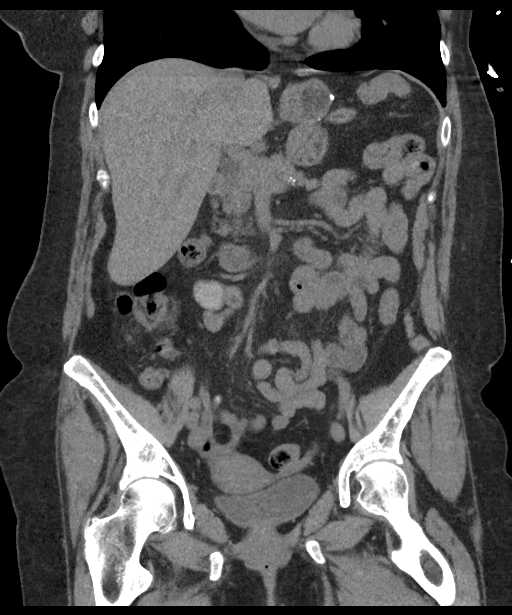
[im 73/132  soft-tissue]
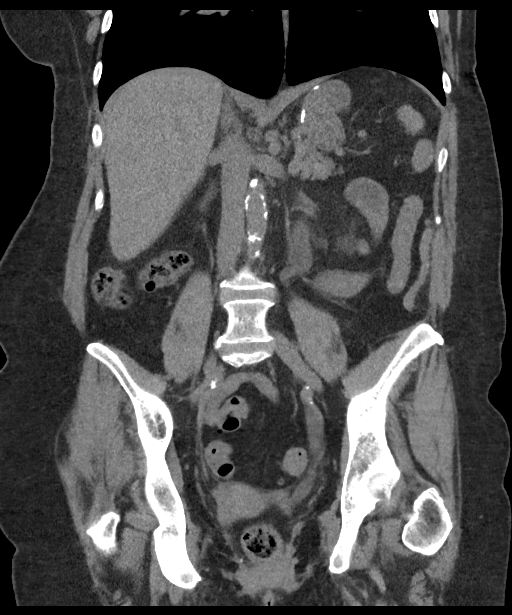

[16 of 46 positions shown; findings below may reference images not displayed]

FINDINGS: Lower chest: The visualized lung bases are clear.

Hepatobiliary: No focal liver abnormality is seen. There has been
prior cholecystectomy, and there is dilatation of the common bile
duct to 13 mm diameter with distal tapering and no obstructing stone
identified by CT.

Pancreas: Punctate scattered pancreatic calcifications which may
reflect the sequelae of chronic pancreatitis. No ductal dilatation
or acute peripancreatic inflammatory change.

Spleen: Unremarkable.

Adrenals/Urinary Tract: Unremarkable adrenal glands. No renal
calculi or right-sided hydronephrosis. There is mild left
hydroureteronephrosis secondary to a 6 mm stone projecting into the
bladder lumen at the UVJ.

Stomach/Bowel: Sequelae of gastric bypass are identified. There is
no evidence of bowel obstruction. Mild sigmoid colon diverticulosis
is noted without evidence of diverticulitis. The appendix is
unremarkable.

Vascular/Lymphatic: Diffuse abdominal aortic atherosclerosis without
aneurysm. No enlarged lymph nodes.

Reproductive: Unremarkable uterus and ovaries.

Other: No intraperitoneal free fluid. No abdominal wall mass or
hernia. Right greater than left rectus muscle fatty atrophy.

Musculoskeletal: Thoracolumbar disc degeneration most advanced at
T10-11. Right-sided facet ankylosis at T11-12.
IMPRESSION: 1. 6 mm obstructing left UVJ stone with mild hydroureteronephrosis.
2. Prior cholecystectomy with extrahepatic biliary dilatation.
Recommend laboratory correlation.
3.  Aortic Atherosclerosis ([id number]).
